# Patient Record
Sex: Male | Born: 1980 | Race: White | Hispanic: No | Marital: Married | Smoking: Never smoker
Health system: Southern US, Community
[De-identification: ages and names within clinical notes are randomized; demographics above are authoritative.]

## PROBLEM LIST (undated history)

## (undated) HISTORY — PX: SKIN GRAFT: SHX250

---

## 2014-08-30 ENCOUNTER — Ambulatory Visit (INDEPENDENT_AMBULATORY_CARE_PROVIDER_SITE_OTHER): Payer: No Typology Code available for payment source | Admitting: Internal Medicine

## 2014-08-30 ENCOUNTER — Ambulatory Visit (INDEPENDENT_AMBULATORY_CARE_PROVIDER_SITE_OTHER): Payer: No Typology Code available for payment source

## 2014-08-30 VITALS — BP 118/76 | HR 66 | Temp 98.3°F | Resp 18 | Ht 68.5 in | Wt 175.0 lb

## 2014-08-30 DIAGNOSIS — M7751 Other enthesopathy of right foot: Secondary | ICD-10-CM

## 2014-08-30 DIAGNOSIS — M25571 Pain in right ankle and joints of right foot: Secondary | ICD-10-CM

## 2014-08-30 DIAGNOSIS — M6588 Other synovitis and tenosynovitis, other site: Secondary | ICD-10-CM | POA: Diagnosis not present

## 2014-08-30 MED ORDER — PREDNISONE 20 MG PO TABS: ORAL_TABLET | ORAL | Status: AC

## 2014-08-30 NOTE — Progress Notes (Signed)
08/30/2014 at 11:11 AM  Kristopher GarretBrandon Smith / DOB: Sep 11, 1980 / MRN: [id number]  The patient  does not have a problem list on file.  SUBJECTIVE  Chief complaint: Ankle Pain   Ankle Pain: Patient complains of right ankle pain.  Onset of the symptoms was 2 weeks ago. Inciting event: none known. Current symptoms include ability to bear weight, but with some pain, pain at the posterior, lateral and posterior aspect of the malleolous.   aspect of the ankle and worsening symptoms after a period of activity.  Aggravating symptoms: any weight bearing. Patient's course of pain: symptoms have progressed to a point and plateaued. Patient has had no prior ankle problems. Previous visits for this problem: none. Evaluation to date: none.  Treatment to date: OTC analgesics which are effective but not prolonged.     He  has no past medical history on file.    Medications reviewed and updated by myself where necessary, and exist elsewhere in the encounter.   Mr. Kristopher Smith is allergic to penicillins. He  reports that he has never smoked. He does not have any smokeless tobacco history on file. He reports that he drinks about 4.2 oz of alcohol per week. He reports that he does not use illicit drugs. He  has no sexual activity history on file. The patient  has past surgical history that includes Skin graft.  His family history is not on file.  ROS  OBJECTIVE  His  height is 5' 8.5" (1.74 m) and weight is 175 lb (79.379 kg). His oral temperature is 98.3 F (36.8 C). His blood pressure is 118/76 and his pulse is 66. His respiration is 18 and oxygen saturation is 99%.  The patient's body mass index is 26.22 kg/(m^2).  Physical Exam  Constitutional: He is oriented to person, place, and time. He appears well-developed and well-nourished.  Cardiovascular: Normal rate.   Respiratory: Effort normal.  Musculoskeletal:       Right ankle: He exhibits normal range of motion, no swelling, no ecchymosis, no deformity and  normal pulse. Tenderness. Lateral malleolus tenderness found. No AITFL, no CF ligament, no posterior TFL, no head of 5th metatarsal and no proximal fibula tenderness found. Achilles tendon normal.       Left ankle: Normal.       Feet:  Neurological: He is alert and oriented to person, place, and time.  Skin: Skin is warm and dry.  Psychiatric: He has a normal mood and affect.   [facility] reading (PRIMARY) by  Dr. Merla Richesoolittle: No osseous abnormality.   No results found for this or any previous visit (from the past 24 hour(s)).  ASSESSMENT & PLAN  Kristopher Smith was seen today for ankle pain.  Diagnoses and all orders for this visit:  Pain in joint, ankle and foot, right Orders: -     DG Ankle Complete Right; Future  Tendinitis of right ankle Orders: -     predniSONE (DELTASONE) 20 MG tablet; Take 3 PO QAM x3days, 2 PO QAM x3days, 1 PO QAM x3days -      Ankle brace provided in the office.  -      Will refer to ortho in two weeks at the patient's request if this plan is unsuccessful.     The patient was advised to call or come back to clinic if he does not see an improvement in symptoms, or worsens with the above plan.   Kristopher BostonMichael Smith, MHS, PA-C [facility]  08/30/2014 11:11 AM I have participated in the care of this patient with the Advanced Practice Provider and agree with Diagnosis and Plan as documented. Robert P. Merla Riches, M.D.

## 2014-08-30 NOTE — Patient Instructions (Signed)
Do not take ibuprofen, aspirin, or aleve while taking prednisone. Please wear the ankle brace daily for the next two weeks and ice your foot a few times a day. Take tylenol if you need added pain relief.  If your symptoms are not gone in two weeks [facility] and treatment.

## 2016-06-08 IMAGING — CR DG ANKLE COMPLETE 3+V*R*
4 series · 4 of 4 positions shown · non-contrast
Comparison: None.

CLINICAL DATA: Right ankle pain for the past 2 weeks center below
the malleolus, symptoms exacerbated by activity

EXAM:
RIGHT ANKLE - COMPLETE 3+ VIEW

[AP]
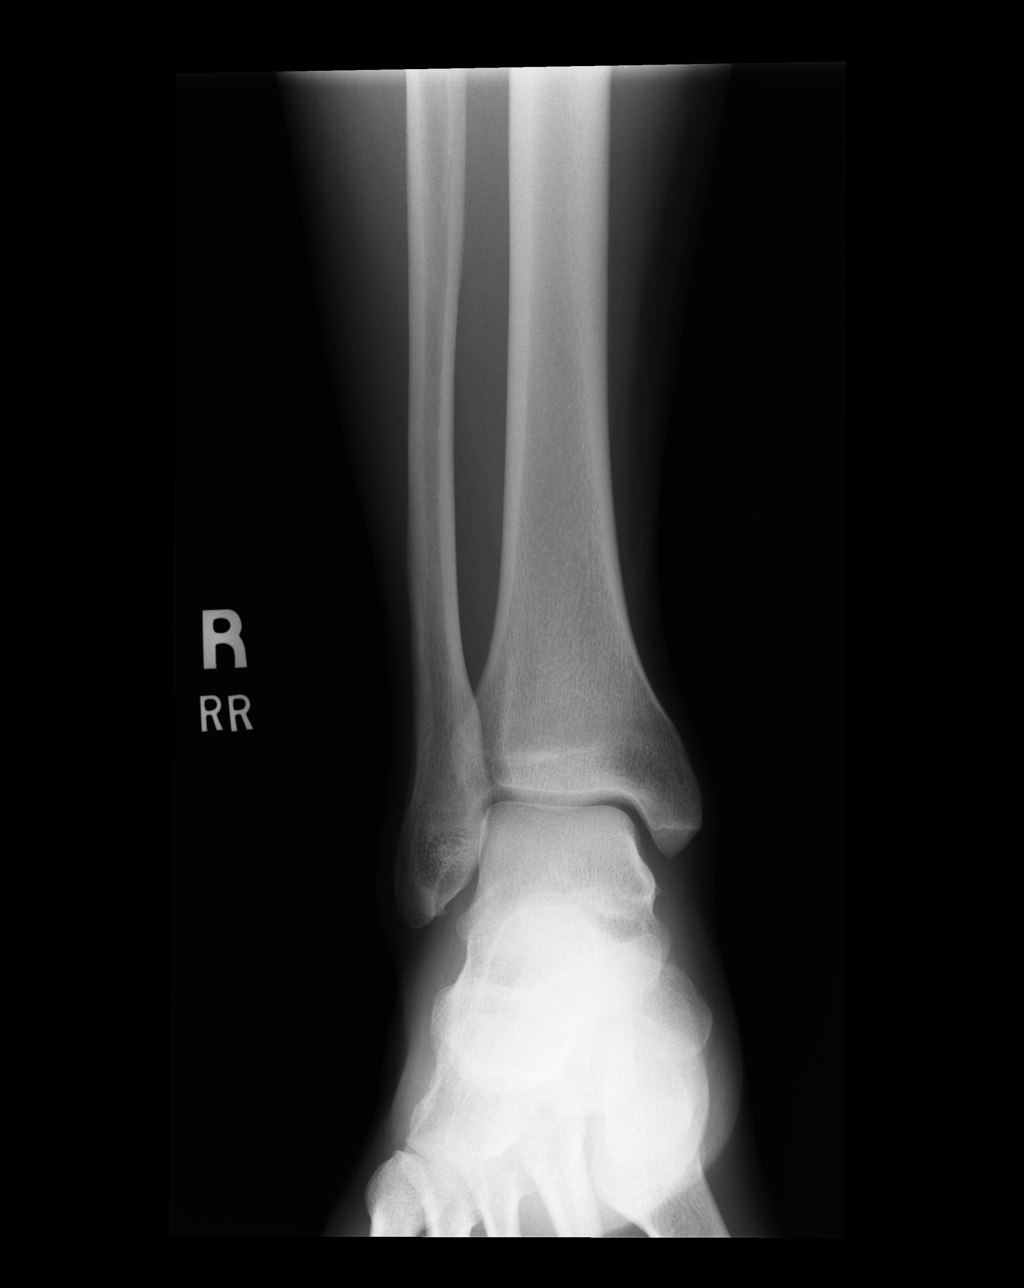

[ap obl int rot (1 of 2)]
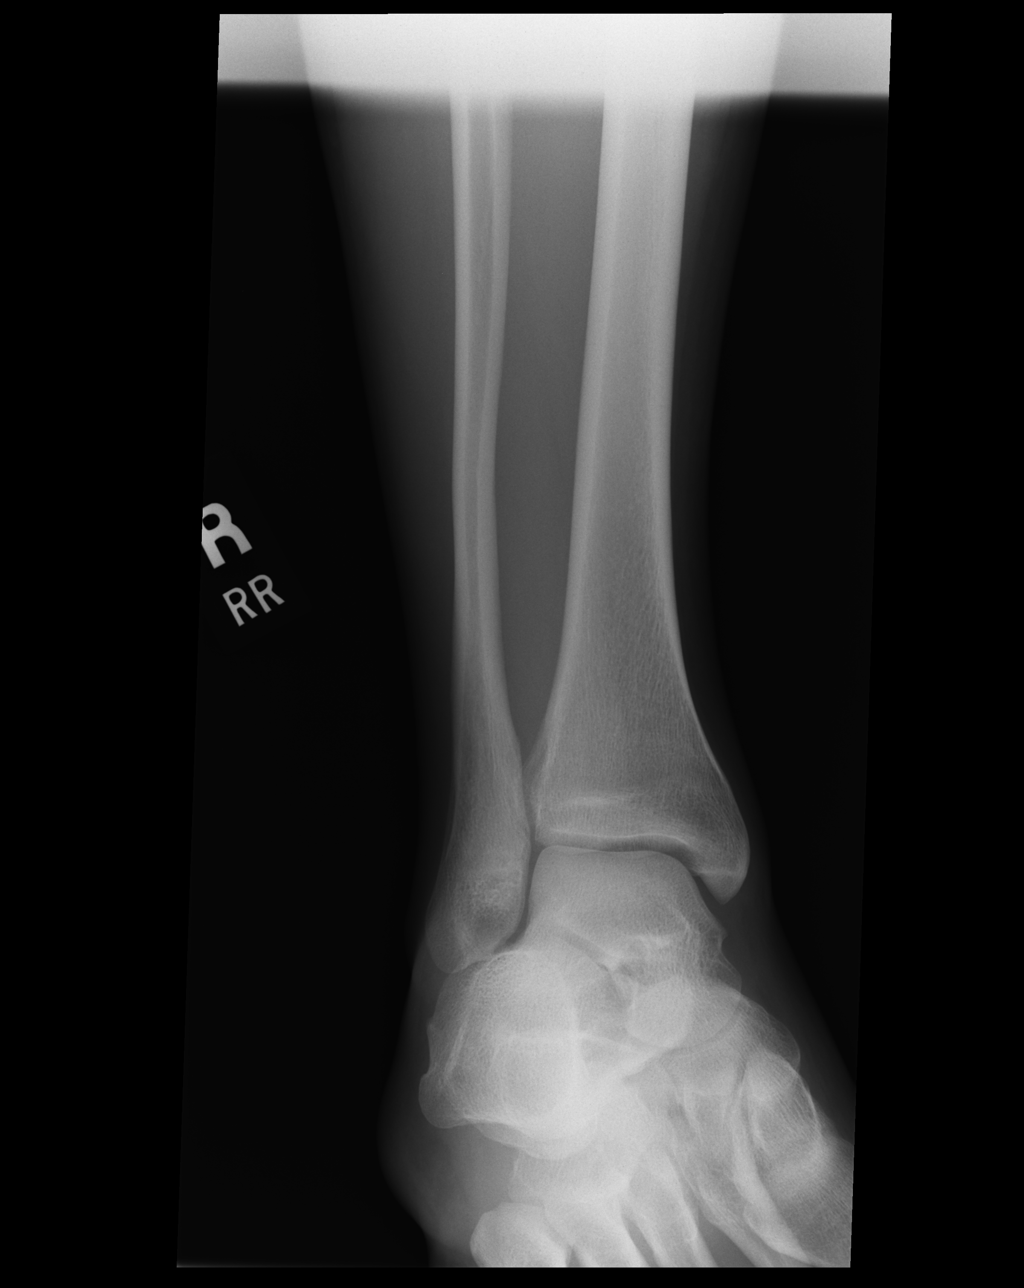

[ap obl int rot (2 of 2)]
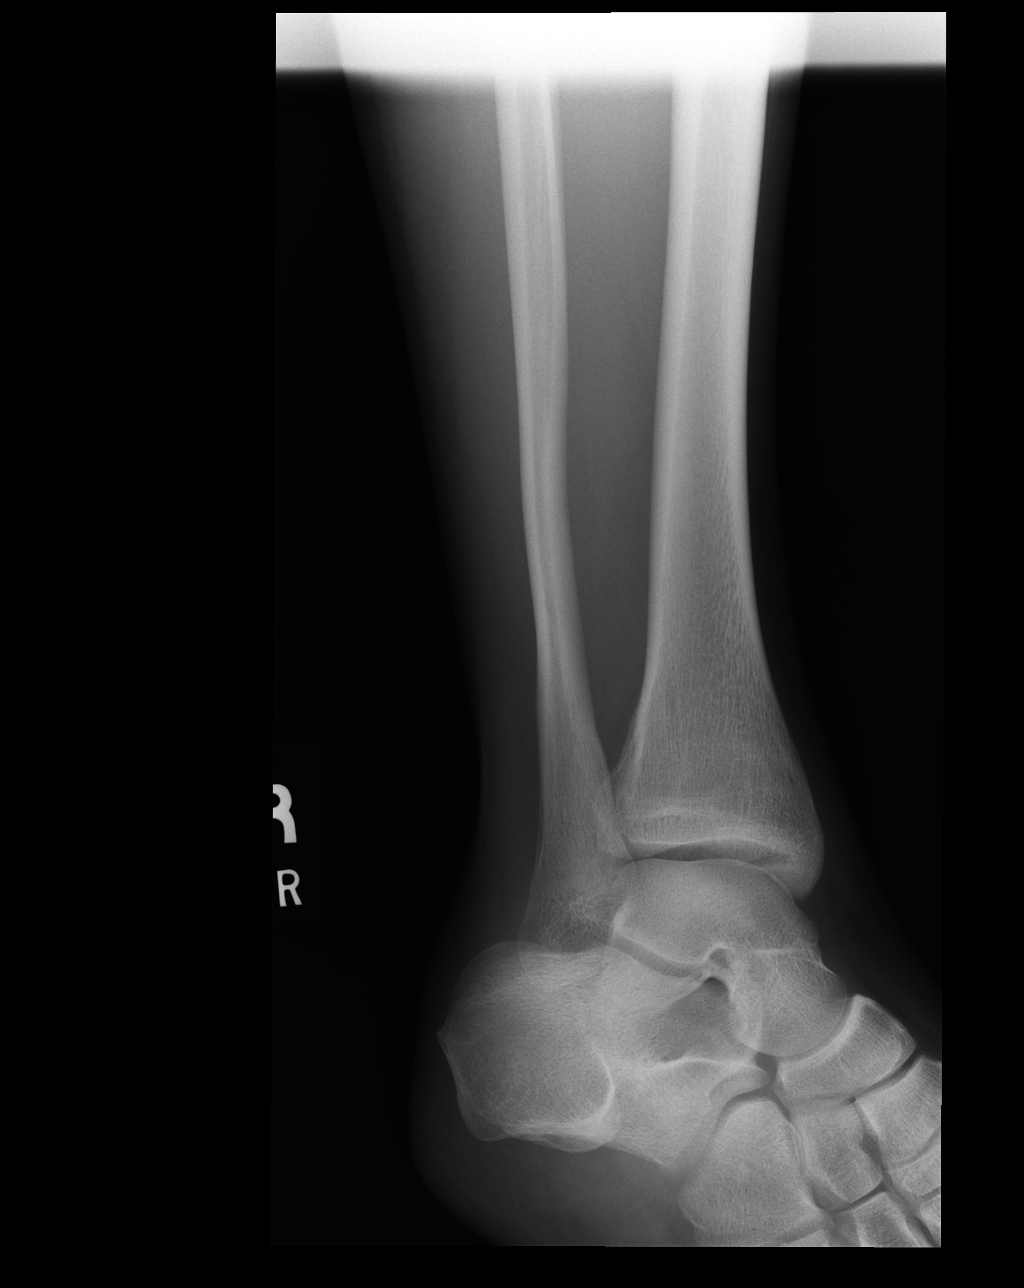

[lateral]
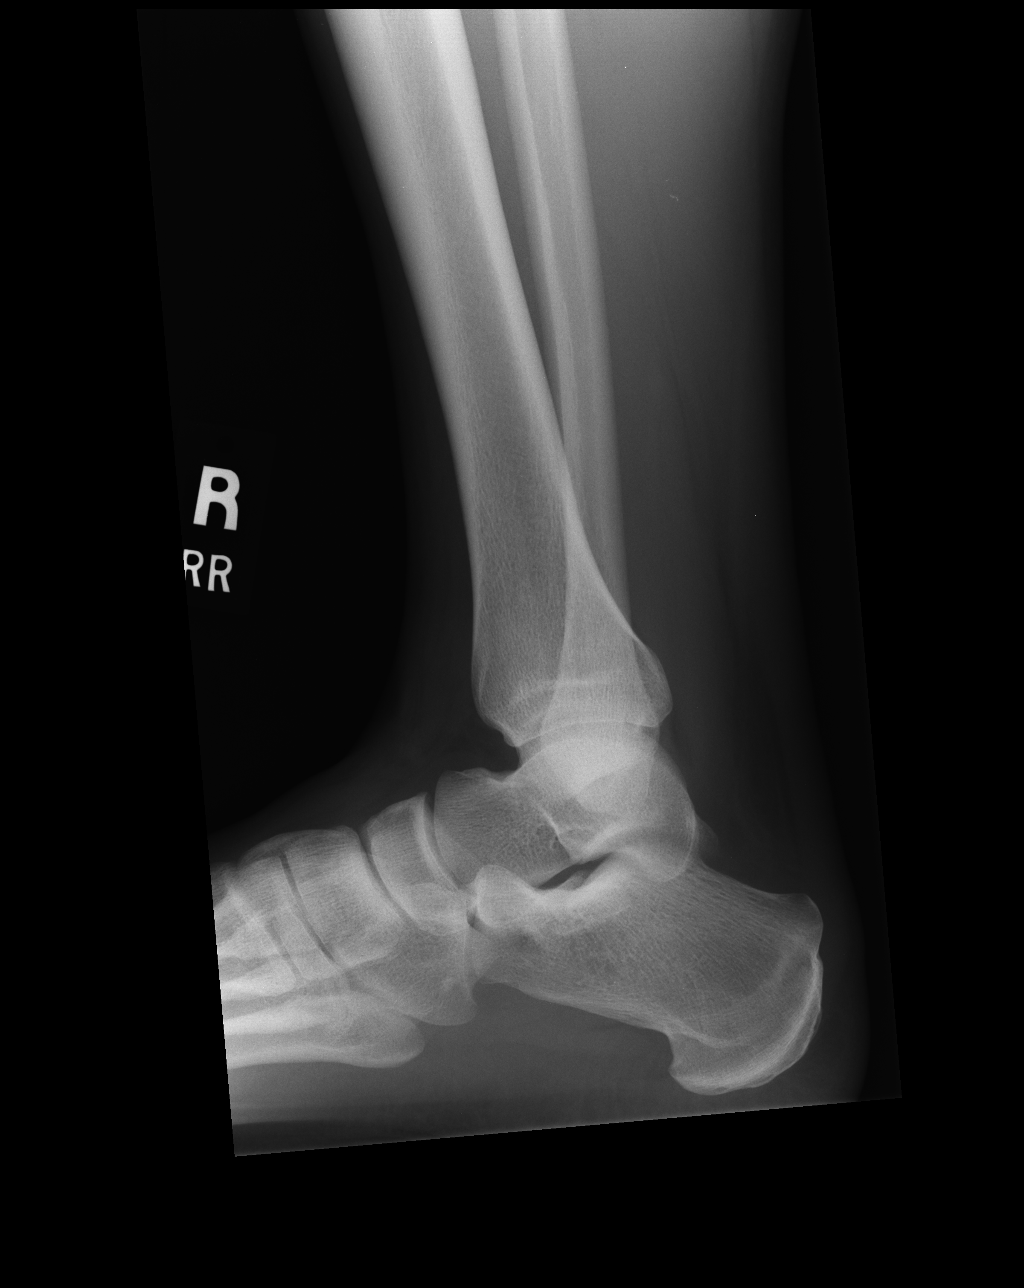

[4 of 4 positions shown; findings below may reference images not displayed]

FINDINGS: The ankle joint mortise is preserved. The talar dome is intact.
There is no acute or healing malleolar fracture. The talus and
calcaneus are unremarkable. The soft tissues exhibit mild swelling
anteriorly.
IMPRESSION: There is no acute or significant chronic bony abnormality of the
right ankle.
